# Patient Record
Sex: Male | Born: 2000 | Race: Black or African American | Hispanic: No | Marital: Single | Smoking: Never smoker
Health system: Southern US, Community
[De-identification: ages and names within clinical notes are randomized; demographics above are authoritative.]

## PROBLEM LIST (undated history)

## (undated) DIAGNOSIS — J45909 Unspecified asthma, uncomplicated: Secondary | ICD-10-CM

## (undated) HISTORY — PX: APPENDECTOMY: SHX54

---
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
## 2020-12-04 ENCOUNTER — Ambulatory Visit (HOSPITAL_COMMUNITY)
Admission: EM | Admit: 2020-12-04 | Discharge: 2020-12-04 | Disposition: A | Discharge status: Home / Self Care | Payer: 59

## 2020-12-04 ENCOUNTER — Other Ambulatory Visit: Payer: Self-pay

## 2020-12-06 ENCOUNTER — Other Ambulatory Visit: Payer: Self-pay

## 2020-12-06 ENCOUNTER — Encounter (HOSPITAL_COMMUNITY): Payer: Self-pay

## 2020-12-06 ENCOUNTER — Ambulatory Visit (HOSPITAL_COMMUNITY)
Admission: EM | Admit: 2020-12-06 | Discharge: 2020-12-06 | Disposition: A | Discharge status: Home / Self Care | Payer: 59 | Attending: Physician Assistant | Admitting: Physician Assistant

## 2020-12-06 DIAGNOSIS — R062 Wheezing: Secondary | ICD-10-CM | POA: Diagnosis not present

## 2020-12-06 DIAGNOSIS — R059 Cough, unspecified: Secondary | ICD-10-CM

## 2020-12-06 DIAGNOSIS — Z20822 Contact with and (suspected) exposure to covid-19: Secondary | ICD-10-CM | POA: Diagnosis not present

## 2020-12-06 DIAGNOSIS — R0981 Nasal congestion: Secondary | ICD-10-CM | POA: Diagnosis not present

## 2020-12-06 LAB — RESP PANEL BY RT-PCR (FLU A&B, COVID) ARPGX2
Influenza A by PCR: POSITIVE — AB
Influenza B by PCR: NEGATIVE
SARS Coronavirus 2 by RT PCR: NEGATIVE

## 2020-12-06 MED ORDER — BENZONATATE 200 MG PO CAPS: 200.0000 mg | ORAL_CAPSULE | Freq: Three times a day (TID) | ORAL | 0 refills | Status: DC | PRN

## 2020-12-06 MED ORDER — CETIRIZINE-PSEUDOEPHEDRINE ER 5-120 MG PO TB12: 1.0000 | ORAL_TABLET | Freq: Every day | ORAL | 0 refills | Status: DC

## 2020-12-06 MED ORDER — ALBUTEROL SULFATE HFA 108 (90 BASE) MCG/ACT IN AERS: 2.0000 | INHALATION_SPRAY | RESPIRATORY_TRACT | 0 refills | Status: DC | PRN

## 2020-12-06 NOTE — Discharge Instructions (Signed)
Take medication as needed Go to ED with worsening symptoms. Remain in isolation pending COVID test results.

## 2020-12-06 NOTE — ED Provider Notes (Signed)
[facility]    CSN: [id number] Arrival date & time: 12/06/20  1040      History   Chief Complaint Chief Complaint  Patient presents with  . Chills  . Cough  . Weakness    HPI Bryan Mendoza is a 19 y.o. male.   Patient here c/w fever x 4 days.  tmax unknown, did not check.  He took rapid covid test 2 days ago and was negative.  He admits f/c, nasal congestion, rhinorrhea, cough, wheezing, myalgia.  Father sick with similar sx.  He has not had COVID vaccine.  He would like COVID and flu testing.       Past Medical History:  Diagnosis Date  . Asthma     There are no problems to display for this patient.   History reviewed. No pertinent surgical history.     Home Medications    Prior to Admission medications   Medication Sig Start Date End Date Taking? Authorizing Provider  albuterol (VENTOLIN HFA) 108 (90 Base) MCG/ACT inhaler Inhale 2 puffs into the lungs every 4 (four) hours as needed for wheezing or shortness of breath. 12/06/20   Evern Core, PA-C  benzonatate (TESSALON) 200 MG capsule Take 1 capsule (200 mg total) by mouth 3 (three) times daily as needed for cough. 12/06/20   Evern Core, PA-C  cetirizine-pseudoephedrine (ZYRTEC-D) 5-120 MG tablet Take 1 tablet by mouth daily. 12/06/20   Evern Core, PA-C  permethrin (ELIMITE) 5 % cream Apply to affected area once and leave on x 8-10 hours then rinse.  If no improvement in 3 days, may repeat. 01/25/16   Lowanda Foster, NP  prednisoLONE (ORAPRED) 15 MG/5ML solution 5 ml po BID x 5 days 05/28/12   Pauline Aus, PA-C    Family History History reviewed. No pertinent family history.  Social History Social History   Tobacco Use  . Smoking status: Never Smoker  . Smokeless tobacco: Never Used  Substance Use Topics  . Alcohol use: No  . Drug use: No     Allergies   Codeine   Review of Systems Review of Systems  Constitutional: Positive for chills, fatigue and fever.  HENT:  Positive for congestion, postnasal drip, rhinorrhea and sore throat. Negative for ear pain, nosebleeds, sinus pressure and sinus pain.   Eyes: Negative for pain and redness.  Respiratory: Positive for cough and wheezing. Negative for shortness of breath.   Gastrointestinal: Negative for abdominal pain, diarrhea, nausea and vomiting.  Musculoskeletal: Positive for myalgias. Negative for arthralgias.  Skin: Negative for rash.  Neurological: Negative for light-headedness and headaches.  Hematological: Negative for adenopathy. Does not bruise/bleed easily.  Psychiatric/Behavioral: Negative for confusion and sleep disturbance.     Physical Exam Triage Vital Signs ED Triage Vitals  Enc Vitals Group     BP 12/06/20 1149 131/76     Pulse Rate 12/06/20 1147 92     Resp 12/06/20 1147 18     Temp 12/06/20 1147 97.8 F (36.6 C)     Temp Source 12/06/20 1147 Oral     SpO2 12/06/20 1147 98 %     Weight --      Height --      Head Circumference --      Peak Flow --      Pain Score 12/06/20 1144 3     Pain Loc --      Pain Edu? --      Excl. in GC? --    No data  found.  Updated Vital Signs BP 131/76 (BP Location: Right Arm)   Pulse 92   Temp 97.8 F (36.6 C) (Oral)   Resp 18   SpO2 98%   Visual Acuity Right Eye Distance:   Left Eye Distance:   Bilateral Distance:    Right Eye Near:   Left Eye Near:    Bilateral Near:     Physical Exam Vitals and nursing note reviewed.  Constitutional:      General: He is not in acute distress.    Appearance: Normal appearance. He is not ill-appearing.  HENT:     Head: Normocephalic and atraumatic.     Right Ear: Tympanic membrane and ear canal normal.     Left Ear: Tympanic membrane and ear canal normal.     Nose: Congestion and rhinorrhea present. No mucosal edema. Rhinorrhea is clear.     Right Sinus: No maxillary sinus tenderness or frontal sinus tenderness.     Left Sinus: No maxillary sinus tenderness or frontal sinus tenderness.      Mouth/Throat:     Pharynx: No oropharyngeal exudate or posterior oropharyngeal erythema.  Eyes:     General: No scleral icterus.    Extraocular Movements: Extraocular movements intact.     Conjunctiva/sclera: Conjunctivae normal.  Cardiovascular:     Rate and Rhythm: Normal rate and regular rhythm.     Heart sounds: No murmur heard.   Pulmonary:     Effort: Pulmonary effort is normal. No respiratory distress.     Breath sounds: Wheezing (mild expiratory wheezing) present. No rales.  Musculoskeletal:     Cervical back: Normal range of motion. No rigidity.  Skin:    Capillary Refill: Capillary refill takes less than 2 seconds.     Coloration: Skin is not jaundiced.     Findings: No rash.  Neurological:     General: No focal deficit present.     Mental Status: He is alert and oriented to person, place, and time.     Motor: No weakness.     Gait: Gait normal.  Psychiatric:        Mood and Affect: Mood normal.        Behavior: Behavior normal.      UC Treatments / Results  Labs (all labs ordered are listed, but only abnormal results are displayed) Labs Reviewed  RESP PANEL BY RT-PCR (FLU A&B, COVID) [id number]    EKG   Radiology No results found.  Procedures Procedures (including critical care time)  Medications Ordered in UC Medications - No data to display  Initial Impression / Assessment and Plan / UC Course  I have reviewed the triage vital signs and the nursing notes.  Pertinent labs & imaging results that were available during my care of the patient were reviewed by me and considered in my medical decision making (see chart for details).     Remain in isolation pending COVID test results. Refill inhaler - his inhaler is in [place] Take medication as prescribed. Go to ED with worsening symptoms. Consider prednisone rx if COVID test is negative. Final Clinical Impressions(s) / UC Diagnoses   Final diagnoses:  Wheezing  Cough  Nasal congestion  Encounter  for laboratory testing for COVID-19 virus     Discharge Instructions     Take medication as needed Go to ED with worsening symptoms. Remain in isolation pending COVID test results.    ED Prescriptions    Medication Sig Dispense Auth. Provider   albuterol (VENTOLIN HFA) 108 (  90 Base) MCG/ACT inhaler Inhale 2 puffs into the lungs every 4 (four) hours as needed for wheezing or shortness of breath. 18 g Evern Core, PA-C   cetirizine-pseudoephedrine (ZYRTEC-D) 5-120 MG tablet Take 1 tablet by mouth daily. 30 tablet Evern Core, PA-C   benzonatate (TESSALON) 200 MG capsule Take 1 capsule (200 mg total) by mouth 3 (three) times daily as needed for cough. 20 capsule Evern Core, PA-C     PDMP not reviewed this encounter.   Evern Core, PA-C 12/06/20 1227

## 2020-12-06 NOTE — ED Triage Notes (Signed)
Pt In with c/o fatigue, chills, fever tmax 102, and body aches  States he had covid test 2 days ago with negative result  Pt took tylenol, nyquil, and theraflu with no relief

## 2022-06-06 ENCOUNTER — Encounter (HOSPITAL_COMMUNITY): Payer: Self-pay | Admitting: Emergency Medicine

## 2022-06-06 ENCOUNTER — Ambulatory Visit (HOSPITAL_COMMUNITY)
Admission: EM | Admit: 2022-06-06 | Discharge: 2022-06-06 | Disposition: A | Discharge status: Home / Self Care | Payer: 59 | Attending: Physician Assistant | Admitting: Physician Assistant

## 2022-06-06 DIAGNOSIS — B2790 Infectious mononucleosis, unspecified without complication: Secondary | ICD-10-CM | POA: Diagnosis present

## 2022-06-06 DIAGNOSIS — J029 Acute pharyngitis, unspecified: Secondary | ICD-10-CM | POA: Diagnosis not present

## 2022-06-06 LAB — POCT RAPID STREP A, ED / UC: Streptococcus, Group A Screen (Direct): NEGATIVE

## 2022-06-06 LAB — POCT INFECTIOUS MONO SCREEN, ED / UC: Mono Screen: POSITIVE — AB

## 2022-06-06 NOTE — ED Triage Notes (Signed)
Pt c/o sore throat since Saturday. Pt reports hurts really bad when swallows. Took tylenol and motrin every 4 hours for pain.

## 2022-06-06 NOTE — ED Provider Notes (Signed)
[facility]    CSN: [id number] Arrival date & time: 06/06/22  1057      History   Chief Complaint Chief Complaint  Patient presents with   Sore Throat    HPI Bryan Mendoza is a 21 y.o. male.   Patient presents today with a several day history of sore throat.  Reports pain is rated 8 on a 0-10 pain scale, localized to posterior oropharynx, described as aching, no aggravating relieving factors identified.  Denies any known sick contacts.  He has tried Tylenol and ibuprofen without improvement of symptoms.  He is able to eat and drink despite symptoms reports that it is painful.  Denies any muffled voice, swelling of his throat, dysphagia, shortness of breath.  Denies any recent antibiotics.  He did take an at-home COVID test that was negative.  Denies additional symptoms including congestion, cough, fever, nausea, vomiting.    Past Medical History:  Diagnosis Date   Asthma     There are no problems to display for this patient.   Past Surgical History:  Procedure Laterality Date   APPENDECTOMY         Home Medications    Prior to Admission medications   Not on File    Family History History reviewed. No pertinent family history.  Social History Social History   Tobacco Use   Smoking status: Never   Smokeless tobacco: Never  Substance Use Topics   Alcohol use: No   Drug use: No     Allergies   Codeine   Review of Systems Review of Systems  Constitutional:  Positive for activity change. Negative for appetite change, fatigue and fever.  HENT:  Positive for sore throat and trouble swallowing. Negative for congestion, sinus pressure, sneezing and voice change.   Respiratory:  Negative for cough and shortness of breath.   Cardiovascular:  Negative for chest pain.  Gastrointestinal:  Negative for abdominal pain, diarrhea, nausea and vomiting.  Neurological:  Negative for dizziness, light-headedness and headaches.     Physical Exam Triage  Vital Signs ED Triage Vitals  Enc Vitals Group     BP 06/06/22 1137 125/87     Pulse Rate 06/06/22 1137 93     Resp 06/06/22 1137 14     Temp 06/06/22 1137 98.7 F (37.1 C)     Temp Source 06/06/22 1137 Oral     SpO2 06/06/22 1137 98 %     Weight --      Height --      Head Circumference --      Peak Flow --      Pain Score 06/06/22 1136 7     Pain Loc --      Pain Edu? --      Excl. in GC? --    No data found.  Updated Vital Signs BP 125/87 (BP Location: Right Arm)   Pulse 93   Temp 98.7 F (37.1 C) (Oral)   Resp 14   SpO2 98%   Visual Acuity Right Eye Distance:   Left Eye Distance:   Bilateral Distance:    Right Eye Near:   Left Eye Near:    Bilateral Near:     Physical Exam Vitals reviewed.  Constitutional:      General: He is awake.     Appearance: Normal appearance. He is well-developed. He is not ill-appearing.     Comments: Very pleasant male appears stated age in no acute distress sitting comfortably in exam room  HENT:     Head: Normocephalic and atraumatic.     Right Ear: Ear canal and external ear normal. A middle ear effusion is present. Tympanic membrane is not erythematous or bulging.     Left Ear: Ear canal and external ear normal. A middle ear effusion is present. Tympanic membrane is not erythematous or bulging.     Nose: Nose normal.     Mouth/Throat:     Pharynx: Uvula midline. Posterior oropharyngeal erythema present. No oropharyngeal exudate or uvula swelling.     Tonsils: Tonsillar exudate present. No tonsillar abscesses. 1+ on the right. 1+ on the left.  Cardiovascular:     Rate and Rhythm: Normal rate and regular rhythm.     Heart sounds: Normal heart sounds, S1 normal and S2 normal. No murmur heard. Pulmonary:     Effort: Pulmonary effort is normal. No accessory muscle usage or respiratory distress.     Breath sounds: Normal breath sounds. No stridor. No wheezing, rhonchi or rales.     Comments: Clear to auscultation  bilaterally Abdominal:     General: Bowel sounds are normal.     Palpations: Abdomen is soft.     Tenderness: There is no abdominal tenderness.  Neurological:     Mental Status: He is alert.  Psychiatric:        Behavior: Behavior is cooperative.      UC Treatments / Results  Labs (all labs ordered are listed, but only abnormal results are displayed) Labs Reviewed  POCT INFECTIOUS MONO SCREEN, ED / UC - Abnormal; Notable for the following components:      Result Value   Mono Screen POSITIVE (*)    All other components within normal limits  CULTURE, GROUP A STREP [facility])  POCT RAPID STREP A, ED / UC    EKG   Radiology No results found.  Procedures Procedures (including critical care time)  Medications Ordered in UC Medications - No data to display  Initial Impression / Assessment and Plan / UC Course  I have reviewed the triage vital signs and the nursing notes.  Pertinent labs & imaging results that were available during my care of the patient were reviewed by me and considered in my medical decision making (see chart for details).     Strep testing was negative in clinic today.  Mono testing was positive.  Discussed this is the likely cause of symptoms.  Discussed that we do not have a specific treatment and we recommend supportive care.  He is to gargle with warm salt water and alternate Tylenol ibuprofen for pain.  Discussed splenomegaly is common with mono and he is to avoid contact sports/strenuous activity for minimum of 6 weeks.  He is to follow-up with his PCP next week.  If he develops any worsening symptoms he is to return for reevaluation to which he expressed understanding.  Work excuse note provided.  Final Clinical Impressions(s) / UC Diagnoses   Final diagnoses:  Infectious mononucleosis without complication, infectious mononucleosis due to unspecified organism  Sore throat     Discharge Instructions      You tested positive for mono.  This is a  virus.  Gargle with warm salt water and use Tylenol ibuprofen for pain.  This can make you heart spleen large so it is important that you avoid strenuous activity/contact sports for minimum of 6 weeks.  If you have any worsening symptoms you need to return for reevaluation.  Follow-up with your PCP next week.  ED Prescriptions   None    PDMP not reviewed this encounter.   Jeani Hawking, PA-C 06/06/22 1304

## 2022-06-07 LAB — CULTURE, GROUP A STREP (THRC)

## 2022-11-09 ENCOUNTER — Ambulatory Visit (HOSPITAL_COMMUNITY)
Admission: RE | Admit: 2022-11-09 | Discharge: 2022-11-09 | Disposition: A | Discharge status: Home / Self Care | Payer: 59 | Source: Ambulatory Visit | Attending: Gerontology | Admitting: Gerontology

## 2022-11-09 ENCOUNTER — Other Ambulatory Visit (HOSPITAL_COMMUNITY): Payer: Self-pay | Admitting: Gerontology

## 2022-11-09 DIAGNOSIS — R059 Cough, unspecified: Secondary | ICD-10-CM | POA: Insufficient documentation

## 2023-07-20 ENCOUNTER — Emergency Department (HOSPITAL_COMMUNITY)
Admission: EM | Admit: 2023-07-20 | Discharge: 2023-07-20 | Disposition: A | Discharge status: Home / Self Care | Payer: 59

## 2023-07-20 ENCOUNTER — Other Ambulatory Visit: Payer: Self-pay

## 2023-07-20 ENCOUNTER — Encounter (HOSPITAL_COMMUNITY): Payer: Self-pay | Admitting: Emergency Medicine

## 2023-07-20 DIAGNOSIS — J4521 Mild intermittent asthma with (acute) exacerbation: Secondary | ICD-10-CM | POA: Diagnosis not present

## 2023-07-20 DIAGNOSIS — R0602 Shortness of breath: Secondary | ICD-10-CM | POA: Diagnosis present

## 2023-07-20 MED ORDER — IPRATROPIUM-ALBUTEROL 0.5-2.5 (3) MG/3ML IN SOLN
3.0000 mL | Freq: Once | RESPIRATORY_TRACT | Status: AC
  Administered 2023-07-20: 3 mL via RESPIRATORY_TRACT
  Filled 2023-07-20: qty 3

## 2023-07-20 MED ORDER — ALBUTEROL SULFATE HFA 108 (90 BASE) MCG/ACT IN AERS
2.0000 | INHALATION_SPRAY | RESPIRATORY_TRACT | Status: DC | PRN
  Administered 2023-07-20: 2 via RESPIRATORY_TRACT
  Filled 2023-07-20: qty 6.7

## 2023-07-20 MED ORDER — ALBUTEROL SULFATE (2.5 MG/3ML) 0.083% IN NEBU: 2.5000 mg | INHALATION_SOLUTION | Freq: Four times a day (QID) | RESPIRATORY_TRACT | 0 refills | Status: AC | PRN

## 2023-07-20 MED ORDER — ALBUTEROL SULFATE (2.5 MG/3ML) 0.083% IN NEBU
2.5000 mg | INHALATION_SOLUTION | Freq: Once | RESPIRATORY_TRACT | Status: AC
  Administered 2023-07-20: 2.5 mg via RESPIRATORY_TRACT

## 2023-07-20 NOTE — Discharge Instructions (Signed)
Follow-up with your primary doctor.  Return immediately felt fevers, chills, chest pain, shortness of breath, lightheadedness or any new or worsening symptoms that are concerning to you.

## 2023-07-20 NOTE — ED Triage Notes (Addendum)
Pt with asthma with c/o cough and wheezing since this morning. Pt has an inhaler but just recently moved here and does not have his inhaler or nebulizer machine with him.

## 2023-07-20 NOTE — ED Provider Notes (Signed)
[place] EMERGENCY DEPARTMENT AT [facility] Provider Note   CSN: [id number] Arrival date & time: 07/20/23  1944     History  Chief Complaint  Patient presents with   Shortness of Breath    Bryan Mendoza is a 22 y.o. male.  22 year old male with asthma presenting emergency department with shortness of breath today.  Recently moved from [place] this past week to the area.  He does not have any evidence albuterol.  Start developing shortness of breath today.  No lightheadedness, dizziness, chest pain.  States his chest feels tight, has slight cough.  No fevers or URI symptoms.   Shortness of Breath      Home Medications Prior to Admission medications   Medication Sig Start Date End Date Taking? Authorizing Provider  albuterol (PROVENTIL) (2.5 MG/3ML) 0.083% nebulizer solution Take 3 mLs (2.5 mg total) by nebulization every 6 (six) hours as needed for wheezing or shortness of breath. 07/20/23  Yes Coral Spikes, DO      Allergies    Codeine    Review of Systems   Review of Systems  Respiratory:  Positive for shortness of breath.     Physical Exam Updated Vital Signs BP (!) 143/71 (BP Location: Left Arm)   Pulse 100   Temp 98 F (36.7 C) (Oral)   Resp 17   Ht 5\' 9"  (1.753 m)   Wt 59 kg   SpO2 95%   BMI 19.20 kg/m  Physical Exam Vitals and nursing note reviewed.  HENT:     Mouth/Throat:     Mouth: Mucous membranes are moist.  Eyes:     Pupils: Pupils are equal, round, and reactive to light.  Cardiovascular:     Rate and Rhythm: Normal rate and regular rhythm.  Pulmonary:     Effort: Pulmonary effort is normal. No tachypnea, accessory muscle usage or respiratory distress.     Breath sounds: Wheezing present.  Abdominal:     General: Bowel sounds are normal.     Palpations: Abdomen is soft.  Skin:    General: Skin is warm and dry.     Capillary Refill: Capillary refill takes less than 2 seconds.  Neurological:     General: No focal  deficit present.     Mental Status: He is alert.  Psychiatric:        Mood and Affect: Mood normal.        Behavior: Behavior normal.     ED Results / Procedures / Treatments   Labs (all labs ordered are listed, but only abnormal results are displayed) Labs Reviewed - No data to display  EKG None  Radiology No results found.  Procedures Procedures    Medications Ordered in ED Medications  albuterol (VENTOLIN HFA) 108 (90 Base) MCG/ACT inhaler 2 puff (2 puffs Inhalation Given 07/20/23 2007)  ipratropium-albuterol (DUONEB) 0.5-2.5 (3) MG/3ML nebulizer solution 3 mL (3 mLs Nebulization Given 07/20/23 2007)  albuterol (PROVENTIL) (2.5 MG/3ML) 0.083% nebulizer solution 2.5 mg (2.5 mg Nebulization Given 07/20/23 2007)    ED Course/ Medical Decision Making/ A&P Clinical Course as of 07/20/23 2118  Thu Jul 20, 2023  2040 Reports feeling much improved after breathing treatments.  Stable for discharge at this time. [TY]    Clinical Course User Index [TY] Coral Spikes, DO  Medical Decision Making Well-appearing 22 year old male present emergency department with shortness of breath in the setting of asthma.  He is afebrile nontachycardic maintaining oxygen saturation on room air.  He is not in significant distress.  Did have some wheezing on exam.  Given DuoNeb with improvement of his symptoms.  Will give prescription for a rescue Hailer as well as nebulizer.  Patient stable for discharge at this time.  Amount and/or Complexity of Data Reviewed Labs:     Details: Consider labs, however patient presentation consistent with asthma exacerbation in the setting of medication noncompliance. Radiology:     Details: Consider chest x-ray, however patient with some wheezing on exam, but largely clear lungs.  No fever or tachycardia to suggest underlying pneumonia.  Risk Prescription drug management.           Final Clinical Impression(s) / ED  Diagnoses Final diagnoses:  Mild intermittent asthma with exacerbation    Rx / DC Orders ED Discharge Orders          Ordered    albuterol (PROVENTIL) (2.5 MG/3ML) 0.083% nebulizer solution  Every 6 hours PRN        07/20/23 2042              Coral Spikes, DO 07/20/23 2118

## 2024-09-17 ENCOUNTER — Emergency Department (HOSPITAL_COMMUNITY)
Admission: EM | Admit: 2024-09-17 | Discharge: 2024-09-17 | Disposition: A | Discharge status: Home / Self Care | Attending: Emergency Medicine | Admitting: Emergency Medicine

## 2024-09-17 ENCOUNTER — Other Ambulatory Visit: Payer: Self-pay

## 2024-09-17 ENCOUNTER — Encounter (HOSPITAL_COMMUNITY): Payer: Self-pay

## 2024-09-17 DIAGNOSIS — Z23 Encounter for immunization: Secondary | ICD-10-CM | POA: Insufficient documentation

## 2024-09-17 DIAGNOSIS — S1091XA Abrasion of unspecified part of neck, initial encounter: Secondary | ICD-10-CM | POA: Insufficient documentation

## 2024-09-17 DIAGNOSIS — J45909 Unspecified asthma, uncomplicated: Secondary | ICD-10-CM | POA: Diagnosis not present

## 2024-09-17 DIAGNOSIS — S0990XA Unspecified injury of head, initial encounter: Secondary | ICD-10-CM

## 2024-09-17 DIAGNOSIS — T148XXA Other injury of unspecified body region, initial encounter: Secondary | ICD-10-CM

## 2024-09-17 DIAGNOSIS — S060X0A Concussion without loss of consciousness, initial encounter: Secondary | ICD-10-CM | POA: Diagnosis not present

## 2024-09-17 MED ORDER — TETANUS-DIPHTH-ACELL PERTUSSIS 5-2-15.5 LF-MCG/0.5 IM SUSP
0.5000 mL | Freq: Once | INTRAMUSCULAR | Status: AC
  Administered 2024-09-17: 0.5 mL via INTRAMUSCULAR
  Filled 2024-09-17: qty 0.5

## 2024-09-17 MED ORDER — CEPHALEXIN 500 MG PO CAPS: 500.0000 mg | ORAL_CAPSULE | Freq: Two times a day (BID) | ORAL | 0 refills | Status: AC
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
## 2024-09-17 NOTE — Discharge Instructions (Signed)
 You were seen for your head injury in the emergency department. It is likely that you had a concussion.  At home, please use tylenol and ibuprofen  for any headaches that you may have.  Take the antibiotics (Keflex) for the scratches that you have.  Return gradually to work and school over the next week. Take breaks if you are having headaches or trouble thinking clearly.   Follow-up with your primary doctor or concussion clinic in 1 week regarding your visit.  DO NOT return to playing sports or activities where you could sustain additional head trauma until cleared to do so by a healthcare provider.   Return immediately to the emergency department if you experience any of the following: severe headache, persistent vomiting, or any other concerning symptoms.    Thank you for visiting our Emergency Department. It was a pleasure taking care of you today.
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
